# Patient Record
Sex: Male | Born: 2000 | Race: Black or African American | Hispanic: No | Marital: Single | Smoking: Current every day smoker
Health system: Southern US, Community
[De-identification: ages and names within clinical notes are randomized; demographics above are authoritative.]

## PROBLEM LIST (undated history)

## (undated) DIAGNOSIS — J45909 Unspecified asthma, uncomplicated: Secondary | ICD-10-CM

## (undated) DIAGNOSIS — R040 Epistaxis: Secondary | ICD-10-CM

## (undated) HISTORY — PX: ROOT CANAL: SHX2363

---
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
## 2018-01-31 ENCOUNTER — Other Ambulatory Visit: Payer: Self-pay

## 2018-01-31 ENCOUNTER — Emergency Department
Admission: EM | Admit: 2018-01-31 | Discharge: 2018-01-31 | Disposition: A | Discharge status: Home / Self Care | Payer: Medicaid Other | Attending: Emergency Medicine | Admitting: Emergency Medicine

## 2018-01-31 ENCOUNTER — Encounter: Payer: Self-pay | Admitting: Emergency Medicine

## 2018-01-31 DIAGNOSIS — J45909 Unspecified asthma, uncomplicated: Secondary | ICD-10-CM | POA: Diagnosis not present

## 2018-01-31 DIAGNOSIS — L738 Other specified follicular disorders: Secondary | ICD-10-CM | POA: Diagnosis not present

## 2018-01-31 DIAGNOSIS — Z79899 Other long term (current) drug therapy: Secondary | ICD-10-CM | POA: Diagnosis not present

## 2018-01-31 DIAGNOSIS — R51 Headache: Secondary | ICD-10-CM | POA: Diagnosis present

## 2018-01-31 DIAGNOSIS — F17290 Nicotine dependence, other tobacco product, uncomplicated: Secondary | ICD-10-CM | POA: Insufficient documentation

## 2018-01-31 MED ORDER — SULFAMETHOXAZOLE-TRIMETHOPRIM 800-160 MG PO TABS: 1.0000 | ORAL_TABLET | Freq: Two times a day (BID) | ORAL | 0 refills | Status: DC

## 2018-01-31 MED ORDER — NAPROXEN 500 MG PO TABS: 500.0000 mg | ORAL_TABLET | Freq: Two times a day (BID) | ORAL | Status: DC

## 2018-01-31 MED ORDER — HYDROXYZINE HCL 50 MG PO TABS: 50.0000 mg | ORAL_TABLET | Freq: Three times a day (TID) | ORAL | 0 refills | Status: AC | PRN

## 2018-01-31 NOTE — ED Notes (Signed)
See triage note  States he noticed a possible abscess to back of neck about 2 weeks ago  Area is larger and more tender

## 2018-01-31 NOTE — Discharge Instructions (Signed)
Follow discharge care instruction.  If no improvement in 7 to 10 days contact dermatology listed above.

## 2018-01-31 NOTE — ED Notes (Signed)
First Nurse Note: Pt c/o knot on the back of his head. Pt is in NAD at this time.

## 2018-01-31 NOTE — ED Provider Notes (Signed)
[facility] Emergency Department Provider Note   ____________________________________________   First MD Initiated Contact with Patient 01/31/18 1316     (approximate)  I have reviewed the triage vital signs and the nursing notes.   HISTORY  Chief Complaint Abscess    HPI Dennis Irwin is a 17 y.o. male patient who presents with 3 nodular lesion to the scalp.  Patient state lesion started.  Status post  haircut 1 month ago.  Patient rates pain as 8/10.  Patient described pain is "ache".  No palliative measure for complaint.  Past Medical History:  Diagnosis Date  . Asthma   . Epistaxis, recurrent     There are no active problems to display for this patient.   Past Surgical History:  Procedure Laterality Date  . ROOT CANAL      Prior to Admission medications   Medication Sig Start Date End Date Taking? Authorizing Provider  albuterol (PROVENTIL HFA;VENTOLIN HFA) 108 (90 Base) MCG/ACT inhaler Inhale 2 puffs into the lungs every 6 (six) hours as needed for wheezing or shortness of breath.    [provider]  diphenhydrAMINE (BENADRYL) 25 mg capsule Take 1 capsule (25 mg total) by mouth every 6 (six) hours as needed. 11/21/17   Triplett, Rulon Eisenmengerari B, FNP  hydrOXYzine (ATARAX/VISTARIL) 50 MG tablet Take 1 tablet (50 mg total) by mouth 3 (three) times daily as needed. 01/31/18   Joni ReiningSmith, Deannie Resetar K, PA-C  montelukast (SINGULAIR) 10 MG tablet Take 10 mg by mouth at bedtime.    [provider]  naproxen (NAPROSYN) 500 MG tablet Take 1 tablet (500 mg total) by mouth 2 (two) times daily with a meal. 01/31/18   Joni ReiningSmith, Reiss Mowrey K, PA-C  predniSONE (STERAPRED UNI-PAK 21 TAB) 10 MG (21) TBPK tablet Take 6 tablets on day 1 Take 5 tablets on day 2 Take 4 tablets on day 3 Take 3 tablets on day 4 Take 2 tablets on day 5 Take 1 tablet on day 6 11/21/17   Triplett, Cari B, FNP  sulfamethoxazole-trimethoprim (BACTRIM DS,SEPTRA DS) 800-160 MG tablet Take 1  tablet by mouth 2 (two) times daily. 01/31/18   Joni ReiningSmith, Alfonzo Arca K, PA-C    Allergies Patient has no known allergies.  No family history on file.  Social History Social History   Tobacco Use  . Smoking status: Current Every Day Smoker    Types: Cigars  . Smokeless tobacco: Never Used  Substance Use Topics  . Alcohol use: No  . Drug use: No    Review of Systems Constitutional: No fever/chills Eyes: No visual changes. ENT: No sore throat. Cardiovascular: Denies chest pain. Respiratory: Denies shortness of breath. Gastrointestinal: No abdominal pain.  No nausea, no vomiting.  No diarrhea.  No constipation. Genitourinary: Negative for dysuria. Musculoskeletal: Negative for back pain. Skin: Papular and nodular lesion in the scalp.   Neurological: Negative for headaches, focal weakness or numbness.   ____________________________________________   PHYSICAL EXAM:  VITAL SIGNS: ED Triage Vitals  Enc Vitals Group     BP 01/31/18 1258 119/81     Pulse Rate 01/31/18 1258 80     Resp 01/31/18 1258 18     Temp 01/31/18 1258 98.2 F (36.8 C)     Temp Source 01/31/18 1258 Oral     SpO2 01/31/18 1258 100 %     Weight 01/31/18 1259 165 lb (74.8 kg)     Height 01/31/18 1259 5\' 9"  (1.753 m)     Head Circumference --  Peak Flow --      Pain Score 01/31/18 1258 8     Pain Loc --      Pain Edu? --      Excl. in GC? --    Constitutional: Alert and oriented. Well appearing and in no acute distress. Cardiovascular: Normal rate, regular rhythm. Grossly normal heart sounds.  Good peripheral circulation. Respiratory: Normal respiratory effort.  No retractions. Lungs CTAB. Gastrointestinal: Soft and nontender. No distention. No abdominal bruits. No CVA tenderness. Musculoskeletal: No lower extremity tenderness nor edema.  No joint effusions. Neurologic:  Normal speech and language. No gross focal neurologic deficits are appreciated. No gait instability. Skin: 2 papular and one larger  lesion in the scalp.   Psychiatric: Mood and affect are normal. Speech and behavior are normal.  ____________________________________________   LABS (all labs ordered are listed, but only abnormal results are displayed)  Labs Reviewed - No data to display ____________________________________________  EKG   ____________________________________________  RADIOLOGY  ED MD interpretation:    Official radiology report(s): No results found.  ____________________________________________   PROCEDURES  Procedure(s) performed: None  Procedures  Critical Care performed: No  ____________________________________________   INITIAL IMPRESSION / ASSESSMENT AND PLAN / ED COURSE  As part of my medical decision making, I reviewed the following data within the electronic MEDICAL RECORD NUMBER    Folliculitis of the scalp status post haircut.  Lesion is nonfluctuant at this time.  Patient given discharge care instruction advised take medication as directed.  Patient advised no improvement in 7 to 10 days to follow-up by calling dermatology clinic listed on his discharge paperwork.      ____________________________________________   FINAL CLINICAL IMPRESSION(S) / ED DIAGNOSES  Final diagnoses:  Bacterial folliculitis     ED Discharge Orders        Ordered    sulfamethoxazole-trimethoprim (BACTRIM DS,SEPTRA DS) 800-160 MG tablet  2 times daily     01/31/18 1331    hydrOXYzine (ATARAX/VISTARIL) 50 MG tablet  3 times daily PRN     01/31/18 1331    naproxen (NAPROSYN) 500 MG tablet  2 times daily with meals     01/31/18 1334       Note:  This document was prepared using Dragon voice recognition software and may include unintentional dictation errors.    Joni Reining, PA-C 01/31/18 1339    Emily Filbert, MD 01/31/18 [phone]

## 2018-01-31 NOTE — ED Triage Notes (Signed)
Pt here with c/o a few abscesses to his head, states they are painful, appears in NAD.

## 2018-02-06 ENCOUNTER — Emergency Department
Admission: EM | Admit: 2018-02-06 | Discharge: 2018-02-06 | Disposition: A | Discharge status: Home / Self Care | Payer: Medicaid Other | Attending: Emergency Medicine | Admitting: Emergency Medicine

## 2018-02-06 DIAGNOSIS — Z79899 Other long term (current) drug therapy: Secondary | ICD-10-CM | POA: Diagnosis not present

## 2018-02-06 DIAGNOSIS — F17210 Nicotine dependence, cigarettes, uncomplicated: Secondary | ICD-10-CM | POA: Insufficient documentation

## 2018-02-06 DIAGNOSIS — J45909 Unspecified asthma, uncomplicated: Secondary | ICD-10-CM | POA: Diagnosis not present

## 2018-02-06 DIAGNOSIS — L989 Disorder of the skin and subcutaneous tissue, unspecified: Secondary | ICD-10-CM | POA: Diagnosis present

## 2018-02-06 MED ORDER — FLUCONAZOLE 200 MG PO TABS: 200.0000 mg | ORAL_TABLET | Freq: Two times a day (BID) | ORAL | 0 refills | Status: DC

## 2018-02-06 MED ORDER — LIDOCAINE HCL 1 % IJ SOLN
5.0000 mL | Freq: Once | INTRAMUSCULAR | Status: AC
  Administered 2018-02-06: 5 mL
  Filled 2018-02-06: qty 5

## 2018-02-06 MED ORDER — LIDOCAINE HCL (PF) 1 % IJ SOLN
INTRAMUSCULAR | Status: AC
  Administered 2018-02-06: 5 mL
  Filled 2018-02-06: qty 5

## 2018-02-06 NOTE — ED Provider Notes (Signed)
[facility] Emergency Department Provider Note  ____________________________________________  Time seen: Approximately 10:55 PM  I have reviewed the triage vital signs and the nursing notes.   HISTORY  Chief Complaint Abscess    HPI Dennis Irwin is a 17 y.o. male presents to the emergency department with a 2 cm x 2 cm left sided posterior neck mass at hairline that developed after patient got his haircut approximately 1 month ago.  Patient has been treated for bacterial folliculitis with bactrim as directed.  Patient reports that region seems to be worsening and has started draining what appears to be pus and blood.  No fever or chills.    Past Medical History:  Diagnosis Date  . Asthma   . Epistaxis, recurrent     There are no active problems to display for this patient.   Past Surgical History:  Procedure Laterality Date  . ROOT CANAL      Prior to Admission medications   Medication Sig Start Date End Date Taking? Authorizing Provider  albuterol (PROVENTIL HFA;VENTOLIN HFA) 108 (90 Base) MCG/ACT inhaler Inhale 2 puffs into the lungs every 6 (six) hours as needed for wheezing or shortness of breath.    [provider]  diphenhydrAMINE (BENADRYL) 25 mg capsule Take 1 capsule (25 mg total) by mouth every 6 (six) hours as needed. 11/21/17   Triplett, Rulon Eisenmenger B, FNP  fluconazole (DIFLUCAN) 200 MG tablet Take 1 tablet (200 mg total) by mouth 2 (two) times daily for 28 days. 02/06/18 03/06/18  Orvil Feil, PA-C  hydrOXYzine (ATARAX/VISTARIL) 50 MG tablet Take 1 tablet (50 mg total) by mouth 3 (three) times daily as needed. 01/31/18   Joni Reining, PA-C  montelukast (SINGULAIR) 10 MG tablet Take 10 mg by mouth at bedtime.    [provider]  naproxen (NAPROSYN) 500 MG tablet Take 1 tablet (500 mg total) by mouth 2 (two) times daily with a meal. 01/31/18   Joni Reining, PA-C  predniSONE (STERAPRED UNI-PAK 21 TAB) 10 MG (21) TBPK tablet  Take 6 tablets on day 1 Take 5 tablets on day 2 Take 4 tablets on day 3 Take 3 tablets on day 4 Take 2 tablets on day 5 Take 1 tablet on day 6 11/21/17   Triplett, Cari B, FNP  sulfamethoxazole-trimethoprim (BACTRIM DS,SEPTRA DS) 800-160 MG tablet Take 1 tablet by mouth 2 (two) times daily. 01/31/18   Joni Reining, PA-C    Allergies Patient has no known allergies.  No family history on file.  Social History Social History   Tobacco Use  . Smoking status: Current Every Day Smoker    Types: Cigars  . Smokeless tobacco: Never Used  Substance Use Topics  . Alcohol use: No  . Drug use: No     Review of Systems  Constitutional: No fever/chills Eyes: No visual changes. No discharge ENT: No upper respiratory complaints. Cardiovascular: no chest pain. Respiratory: no cough. No SOB. Gastrointestinal: No abdominal pain.  No nausea, no vomiting.  No diarrhea.  No constipation. Musculoskeletal: Negative for musculoskeletal pain. Skin: Patient has posterior neck mass Neurological: Negative for headaches, focal weakness or numbness. .  ____________________________________________   PHYSICAL EXAM:  VITAL SIGNS: ED Triage Vitals  Enc Vitals Group     BP --      Pulse Rate 02/06/18 2157 (!) 104     Resp 02/06/18 2157 15     Temp 02/06/18 2157 99.2 F (37.3 C)     Temp  Source 02/06/18 2157 Oral     SpO2 02/06/18 2157 100 %     Weight 02/06/18 2158 165 lb (74.8 kg)     Height 02/06/18 2158 5\' 9"  (1.753 m)     Head Circumference --      Peak Flow --      Pain Score 02/06/18 2203 9     Pain Loc --      Pain Edu? --      Excl. in GC? --      Constitutional: Alert and oriented. Well appearing and in no acute distress. Eyes: Conjunctivae are normal. PERRL. EOMI. Head: Atraumatic. ENT:      Ears: TMs are pearly.      Nose: No congestion/rhinnorhea.      Mouth/Throat: Mucous membranes are moist.  Neck: No stridor.  No cervical spine tenderness to palpation.   Cardiovascular: Normal rate, regular rhythm. Normal S1 and S2.  Good peripheral circulation. Respiratory: Normal respiratory effort without tachypnea or retractions. Lungs CTAB. Good air entry to the bases with no decreased or absent breath sounds. Musculoskeletal: Full range of motion to all extremities. No gross deformities appreciated. Neurologic:  Normal speech and language. No gross focal neurologic deficits are appreciated.  Skin:  Patient has a 2 cm x 2 cm left-sided posterior neck mass where the neck meets hairline.  Region is extremely fluctuant without overlying cellulitis. Psychiatric: Mood and affect are normal. Speech and behavior are normal. Patient exhibits appropriate insight and judgement.   ____________________________________________   LABS (all labs ordered are listed, but only abnormal results are displayed)  Labs Reviewed - No data to display ____________________________________________  EKG   ____________________________________________  RADIOLOGY  No results found.  ____________________________________________    PROCEDURES  Procedure(s) performed:    Procedures  INCISION AND DRAINAGE Performed by: Orvil FeilJaclyn M Annasofia Pohl Consent: Verbal consent obtained. Risks and benefits: risks, benefits and alternatives were discussed Type: abscess  Body area: Posterior neck   Anesthesia: local infiltration  Incision was made with a scalpel.  Local anesthetic: lidocaine 1% without epinephrine  Anesthetic total: 3 ml  Complexity: complex Blunt dissection to break up loculations  Drainage: purulent  Drainage amount: Small  Patient tolerance: Patient tolerated the procedure well with no immediate complications.     Medications  lidocaine (XYLOCAINE) 1 % (with pres) injection 5 mL (5 mLs Infiltration Given 02/06/18 2230)     ____________________________________________   INITIAL IMPRESSION / ASSESSMENT AND PLAN / ED COURSE  Pertinent labs &  imaging results that were available during my care of the patient were reviewed by me and considered in my medical decision making (see chart for details).  Review of the [place] CSRS was performed in accordance of the [facility] prior to dispensing any controlled drugs.      Assessment and plan Scalp mass  Differential diagnosis included bscess versus kerion.  Patient underwent incision and drainage in the emergency department which yielded only a small amount of purulent exudate.  Patient was treated empirically for a Kerion with fluconazole.  Patient was instructed to follow-up with dermatology if symptoms do not improve in 1 month.  Patient was advised to return to the emergency department if symptoms acutely worsen.  All patient questions were answered.   ____________________________________________  FINAL CLINICAL IMPRESSION(S) / ED DIAGNOSES  Final diagnoses:  Scalp lesion      NEW MEDICATIONS STARTED DURING THIS VISIT:  ED Discharge Orders        Ordered    fluconazole (DIFLUCAN) 200  MG tablet  2 times daily     02/06/18 2249          This chart was dictated using voice recognition software/Dragon. Despite best efforts to proofread, errors can occur which can change the meaning. Any change was purely unintentional.    Orvil Feil, PA-C 02/06/18 2310    Don Perking, [place], MD 02/08/18 1540

## 2018-02-06 NOTE — ED Triage Notes (Signed)
Patient c/o abscess to posterior left head.

## 2018-02-28 ENCOUNTER — Emergency Department: Payer: Medicaid Other

## 2018-02-28 ENCOUNTER — Emergency Department
Admission: EM | Admit: 2018-02-28 | Discharge: 2018-02-28 | Disposition: A | Discharge status: Home / Self Care | Payer: Medicaid Other | Attending: Emergency Medicine | Admitting: Emergency Medicine

## 2018-02-28 ENCOUNTER — Other Ambulatory Visit: Payer: Self-pay

## 2018-02-28 ENCOUNTER — Encounter: Payer: Self-pay | Admitting: Emergency Medicine

## 2018-02-28 DIAGNOSIS — R51 Headache: Secondary | ICD-10-CM | POA: Diagnosis not present

## 2018-02-28 DIAGNOSIS — R519 Headache, unspecified: Secondary | ICD-10-CM

## 2018-02-28 DIAGNOSIS — L729 Follicular cyst of the skin and subcutaneous tissue, unspecified: Secondary | ICD-10-CM

## 2018-02-28 DIAGNOSIS — L723 Sebaceous cyst: Secondary | ICD-10-CM | POA: Insufficient documentation

## 2018-02-28 LAB — CBC WITH DIFFERENTIAL/PLATELET
Basophils Absolute: 0 10*3/uL (ref 0–0.1)
Basophils Relative: 0 %
EOS PCT: 2 %
Eosinophils Absolute: 0.1 10*3/uL (ref 0–0.7)
HCT: 43 % (ref 40.0–52.0)
Hemoglobin: 14.5 g/dL (ref 13.0–18.0)
LYMPHS ABS: 2 10*3/uL (ref 1.0–3.6)
LYMPHS PCT: 33 %
MCH: 28.1 pg (ref 26.0–34.0)
MCHC: 33.7 g/dL (ref 32.0–36.0)
MCV: 83.3 fL (ref 80.0–100.0)
Monocytes Absolute: 0.5 10*3/uL (ref 0.2–1.0)
Monocytes Relative: 8 %
Neutro Abs: 3.5 10*3/uL (ref 1.4–6.5)
Neutrophils Relative %: 57 %
PLATELETS: 204 10*3/uL (ref 150–440)
RBC: 5.16 MIL/uL (ref 4.40–5.90)
RDW: 13.4 % (ref 11.5–14.5)
WBC: 6.1 10*3/uL (ref 3.8–10.6)

## 2018-02-28 MED ORDER — CLINDAMYCIN HCL 150 MG PO CAPS: 300.0000 mg | ORAL_CAPSULE | Freq: Three times a day (TID) | ORAL | 0 refills | Status: AC

## 2018-02-28 NOTE — ED Provider Notes (Addendum)
[facility] Emergency Department Provider Note  ____________________________________________   First MD Initiated Contact with Patient 02/28/18 1251     (approximate)  I have reviewed the triage vital signs and the nursing notes.   HISTORY  Chief Complaint Headache    HPI Dennis Irwin is a 17 y.o. male presents emergency department complaining of a cyst like abscess on the scalp which is new and causing him to have a bad headache and more swelling at the area that was drained along side of his neck.  He states he was instructed to follow-up with a dermatologist but has not been able to get an appointment.  He states he finished the Bactroban which helped just a little and he has been applying an ointment to the scalp without any relief.  States he is getting migraines  due to the bumps on the scalp.  Past Medical History:  Diagnosis Date  . Asthma   . Epistaxis, recurrent     There are no active problems to display for this patient.   Past Surgical History:  Procedure Laterality Date  . ROOT CANAL      Prior to Admission medications   Medication Sig Start Date End Date Taking? Authorizing Provider  albuterol (PROVENTIL HFA;VENTOLIN HFA) 108 (90 Base) MCG/ACT inhaler Inhale 2 puffs into the lungs every 6 (six) hours as needed for wheezing or shortness of breath.    [provider]  clindamycin (CLEOCIN) 150 MG capsule Take 2 capsules (300 mg total) by mouth 3 (three) times daily. 02/28/18   Kennidee Heyne, Roselyn BeringSusan W, PA-C  hydrOXYzine (ATARAX/VISTARIL) 50 MG tablet Take 1 tablet (50 mg total) by mouth 3 (three) times daily as needed. 01/31/18   Joni ReiningSmith, Ronald K, PA-C  montelukast (SINGULAIR) 10 MG tablet Take 10 mg by mouth at bedtime.    [provider]    Allergies Patient has no known allergies.  No family history on file.  Social History Social History   Tobacco Use  . Smoking status: Current Every Day Smoker    Types: Cigars    . Smokeless tobacco: Never Used  Substance Use Topics  . Alcohol use: No  . Drug use: No    Review of Systems  Constitutional: No fever/chills positive for headache Eyes: No visual changes. ENT: No sore throat. Respiratory: Denies cough Genitourinary: Negative for dysuria. Musculoskeletal: Negative for back pain. Skin: Negative for rash.  Positive for 2 swollen abscessed cystlike areas    ____________________________________________   PHYSICAL EXAM:  VITAL SIGNS: ED Triage Vitals  Enc Vitals Group     BP 02/28/18 1239 107/64     Pulse Rate 02/28/18 1239 68     Resp 02/28/18 1239 16     Temp 02/28/18 1239 98.3 F (36.8 C)     Temp Source 02/28/18 1239 Oral     SpO2 02/28/18 1239 98 %     Weight 02/28/18 1240 165 lb (74.8 kg)     Height 02/28/18 1240 5\' 10"  (1.778 m)     Head Circumference --      Peak Flow --      Pain Score 02/28/18 1240 10     Pain Loc --      Pain Edu? --      Excl. in GC? --     Constitutional: Alert and oriented. Well appearing and in no acute distress. Eyes: Conjunctivae are normal.  Head: Atraumatic.  Positive for a goose egg sized raised fluctuant lesion on  the top of the scalp, the hair is not present on this lesion, area is tender to palpation Nose: No congestion/rhinnorhea. Neck: Is supple, positive for cervical lymphadenopathy, positive for a swollen questionable abscess which feels fluctuant at the base of the skull and side of the neck.  This is the same area that was drained a few days ago. Mouth/Throat: Mucous membranes are moist.   Cardiovascular: Normal rate, regular rhythm. Respiratory: Normal respiratory effort.  No retractions GU: deferred Musculoskeletal: FROM all extremities, warm and well perfused Neurologic:  Normal speech and language.  Skin:  Skin is warm, dry and intact. No rash noted.  Cystlike lesions as noted on the scalp and side of the neck. Psychiatric: Mood and affect are normal. Speech and behavior are  normal.  ____________________________________________   LABS (all labs ordered are listed, but only abnormal results are displayed)  Labs Reviewed  AEROBIC CULTURE (SUPERFICIAL SPECIMEN)  CBC WITH DIFFERENTIAL/PLATELET   ____________________________________________   ____________________________________________  RADIOLOGY  CT of the head shows no intracranial lesions, the cystlike areas are confined to the scalp  ____________________________________________   PROCEDURES  Procedure(s) performed:   Marland KitchenMarland KitchenIncision and Drainage Date/Time: 02/28/2018 3:31 PM Performed by: Faythe Ghee, PA-C Authorized by: Faythe Ghee, PA-C   Consent:    Consent obtained:  Verbal   Consent given by:  Patient   Risks discussed:  Bleeding, incomplete drainage, pain and infection   Alternatives discussed:  No treatment Location:    Type:  Cyst   Location:  Head   Head location:  Scalp Pre-procedure details:    Skin preparation:  Antiseptic wash Procedure type:    Complexity:  Simple Procedure details:    Needle aspiration: yes     Needle size:  18 G   Drainage:  Purulent and serous   Drainage amount:  Moderate   Wound treatment:  Wound left open   Packing materials:  None Post-procedure details:    Patient tolerance of procedure:  Tolerated well, no immediate complications      ____________________________________________   INITIAL IMPRESSION / ASSESSMENT AND PLAN / ED COURSE  Pertinent labs & imaging results that were available during my care of the patient were reviewed by me and considered in my medical decision making (see chart for details).  Patient is an 17 year old male presents emergency department complaining of 2 cystlike areas one on the scalp and one on the side of the neck.  The one on the side of the neck had recently been drained but has now filled back up with a much wet fluid.  He denies any fever or chills.  He states he did finish the antibiotic.  On  physical exam the scalp lesion is the size of a goose egg and is very fluctuant.  The one on the side of the neck is oval-shaped and fluctuant.  There is a small amount of lymphadenopathy noted surrounding this area.  Due to the patient's complaints of migraine headache since this lesion appeared CT of the head was ordered.  CT of the head is negative for any acute abnormality.  The lesions are confined to the scalp.  A needle aspiration was done of the scalp lesion.  A clear liquid the but somewhat purulent fluid was removed.  This was sent to the lab for culture.  The patient was given a prescription for clindamycin.  He is to follow-up with dermatology.  Phone number was given for [facility] dermatology.  He states he understands and will  comply with my instructions.  He was discharged in stable condition.     As part of my medical decision making, I reviewed the following data within the electronic MEDICAL RECORD NUMBER Nursing notes reviewed and incorporated, Labs reviewed CBC is normal, Radiograph reviewed CT the head shows no acute abnormality and the cystic lesion is confined to the scalp, Notes from prior ED visits and [place] Controlled Substance Database  ____________________________________________   FINAL CLINICAL IMPRESSION(S) / ED DIAGNOSES  Final diagnoses:  Scalp cyst  New onset headache      NEW MEDICATIONS STARTED DURING THIS VISIT:  Discharge Medication List as of 02/28/2018  2:25 PM    START taking these medications   Details  clindamycin (CLEOCIN) 150 MG capsule Take 2 capsules (300 mg total) by mouth 3 (three) times daily., Starting Fri 02/28/2018, Print         Note:  This document was prepared using Dragon voice recognition software and may include unintentional dictation errors.    Faythe Ghee, PA-C 02/28/18 1552    Faythe Ghee, PA-C 02/28/18 1553    Jeanmarie Plant, MD 03/01/18 1322

## 2018-02-28 NOTE — ED Triage Notes (Signed)
Patient presents to the ED complaining of a headache that is in the area where he has an abscess on his head.  Patient has taken antibiotics for abscess.  Patient was seen approx. 3 weeks ago and states he has about 4 pills left.  Patient is in no obvious distress at this time.  States area on his head is throbbing.

## 2018-02-28 NOTE — ED Notes (Addendum)
Pt states H/A started 3 days ago. Pt took 4X Ibuprofen within the last 3 days to decrease H/A. Sleeping helps with H/A as well. No distress noted. Pt A/O X4

## 2018-02-28 NOTE — Discharge Instructions (Addendum)
Take medication as prescribed.  Follow-up with dermatology.  Apply warm compress to this area.  Return if worsening

## 2018-03-03 LAB — AEROBIC CULTURE W GRAM STAIN (SUPERFICIAL SPECIMEN): Special Requests: NORMAL

## 2018-03-03 LAB — AEROBIC CULTURE  (SUPERFICIAL SPECIMEN): CULTURE: NO GROWTH

## 2018-09-29 IMAGING — CT CT HEAD W/O CM
3 series · 15 of 47 positions shown, 18 images · non-contrast
Comparison: None.

CLINICAL DATA: Headache.  Recent scalp abscess

EXAM:
CT HEAD WITHOUT CONTRAST
TECHNIQUE: Contiguous axial images were obtained from the base of the skull
through the vertex without intravenous contrast.

[Series 2: head wo · axial · 0.41mm/px · z∈[-58,+67]mm · 9 of 30 slices shown, 12 images]
[im 3/30  brain]
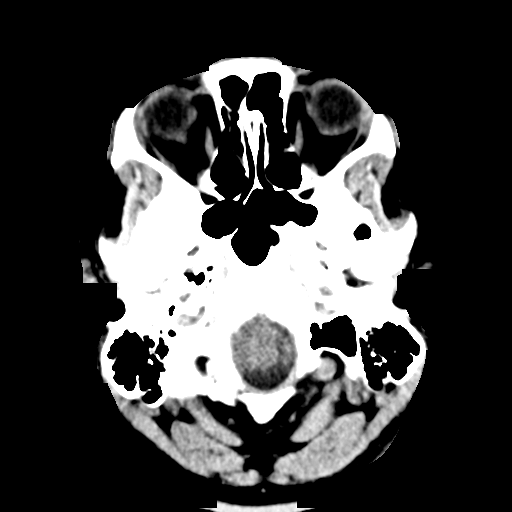
[im 3/30  bone]
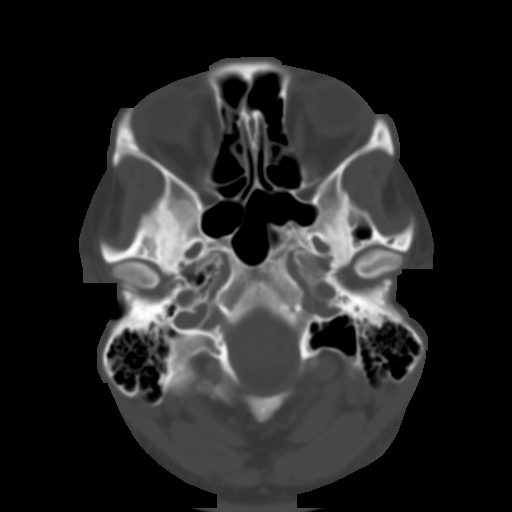
[im 6/30  brain]
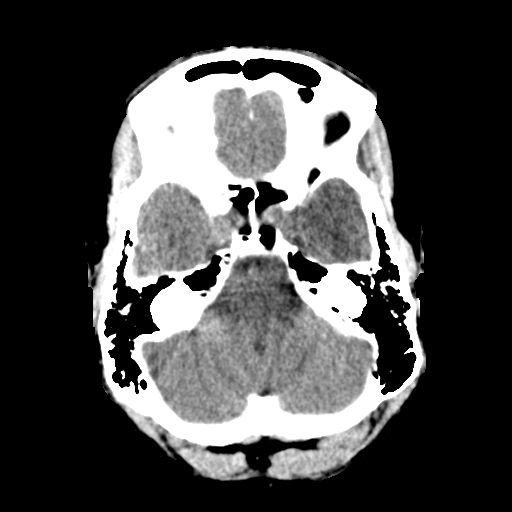
[im 9/30  brain]
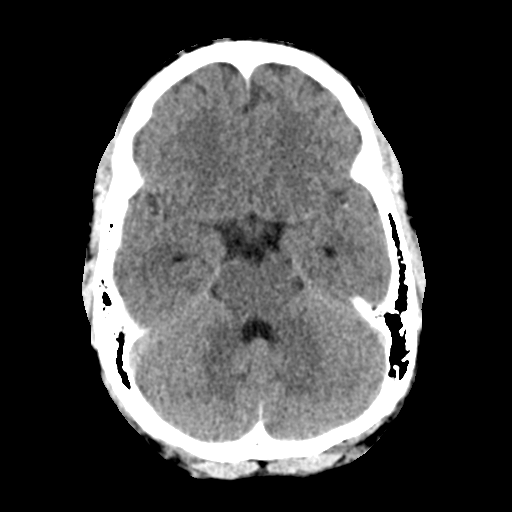
[im 12/30  brain]
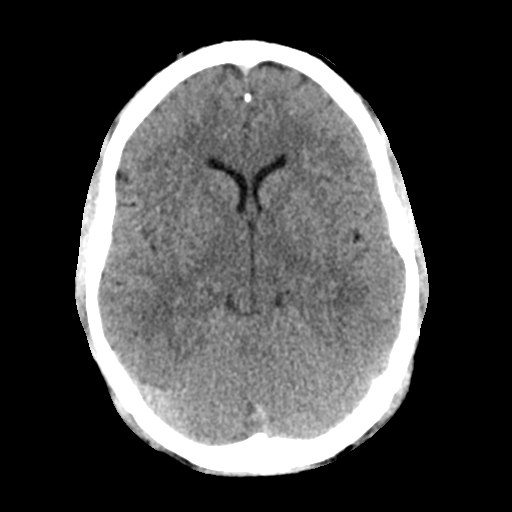
[im 16/30  brain]
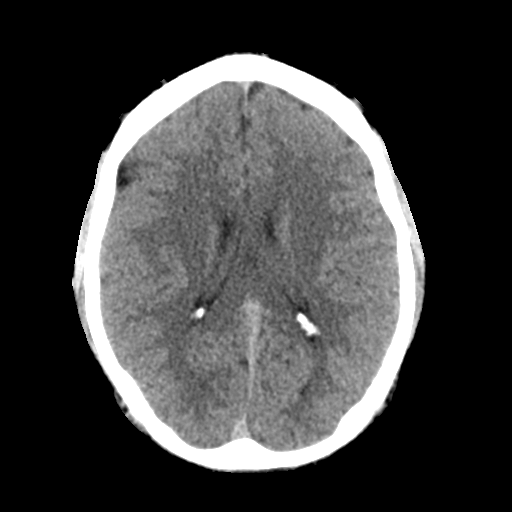
[im 16/30  bone]
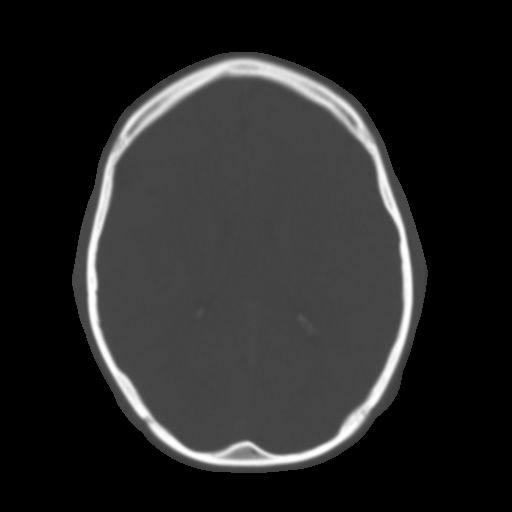
[im 19/30  brain]
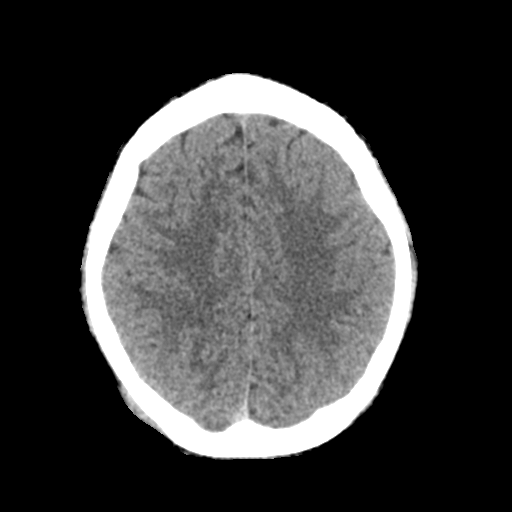
[im 22/30  brain]
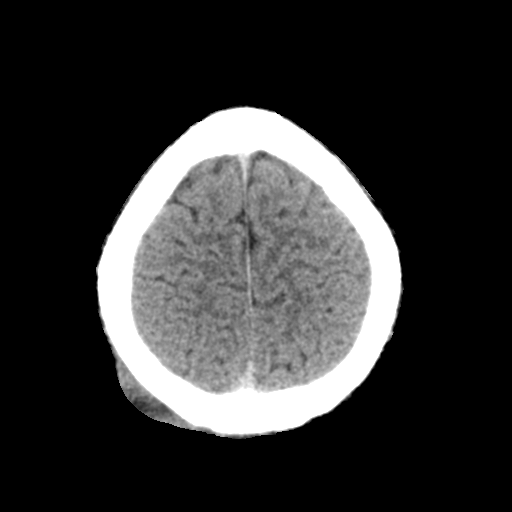
[im 25/30  brain]
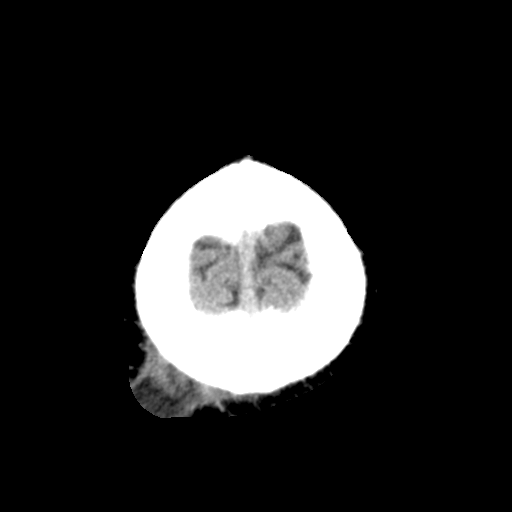
[im 28/30  brain]
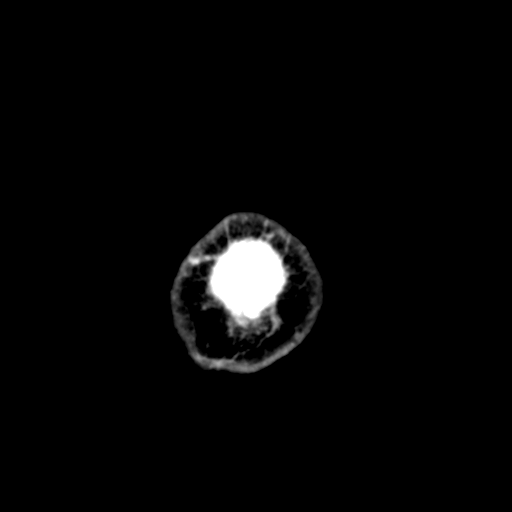
[im 28/30  bone]
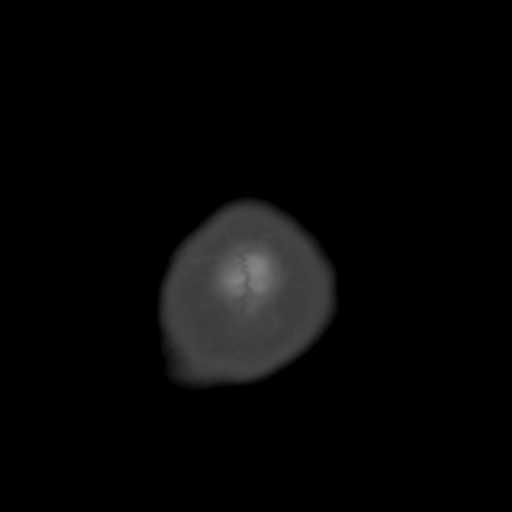

[Series 4: coronal soft tissue · coronal · 0.30mm/px · 3 of 61 slices shown]
[im 21/61  brain]
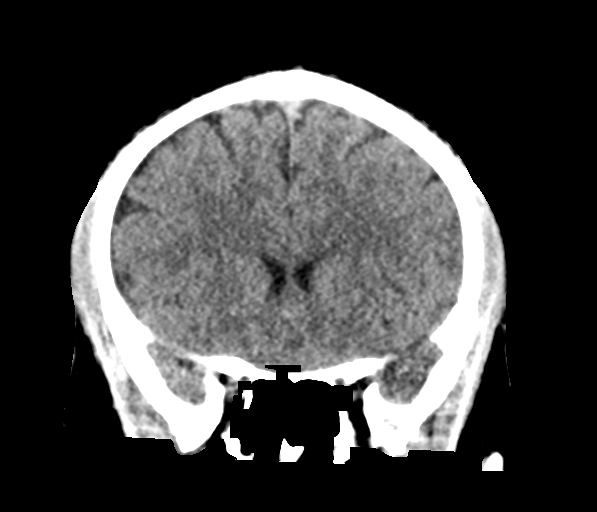
[im 27/61  brain]
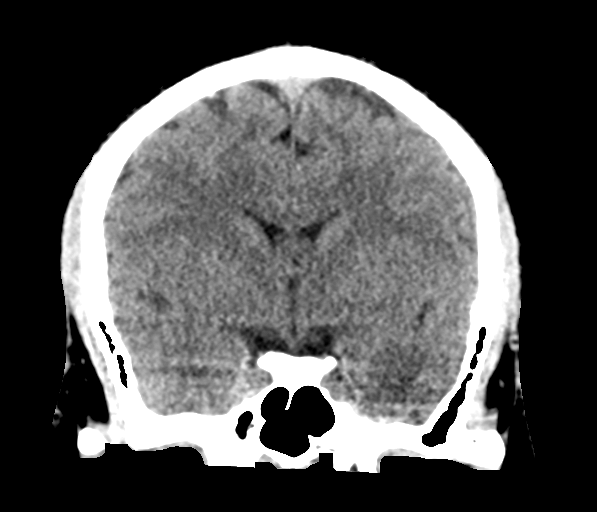
[im 34/61  brain]
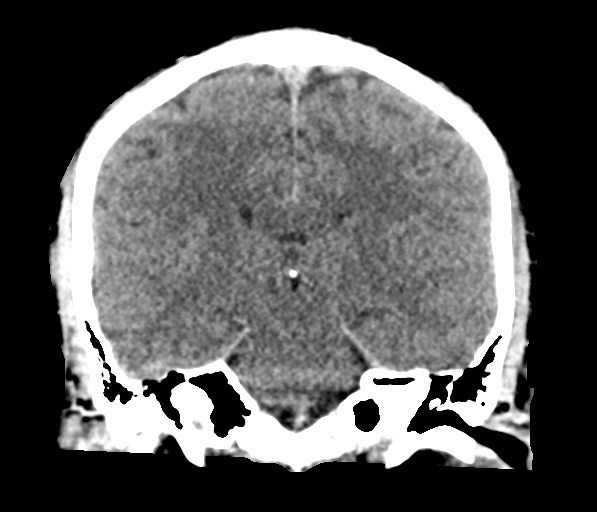

[Series 5: sagittal soft tissue · sagittal · 0.31mm/px · 3 of 52 slices shown]
[im 18/52  brain]
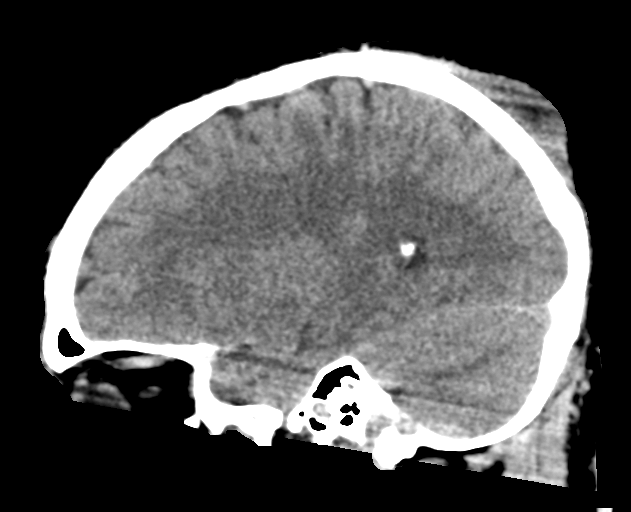
[im 26/52  brain]
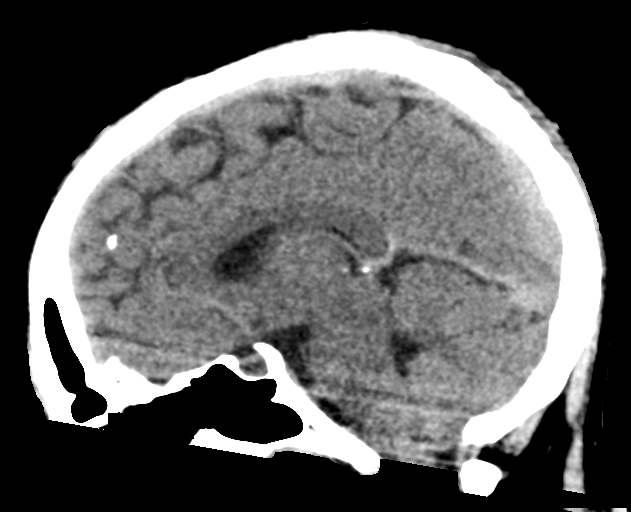
[im 35/52  brain]
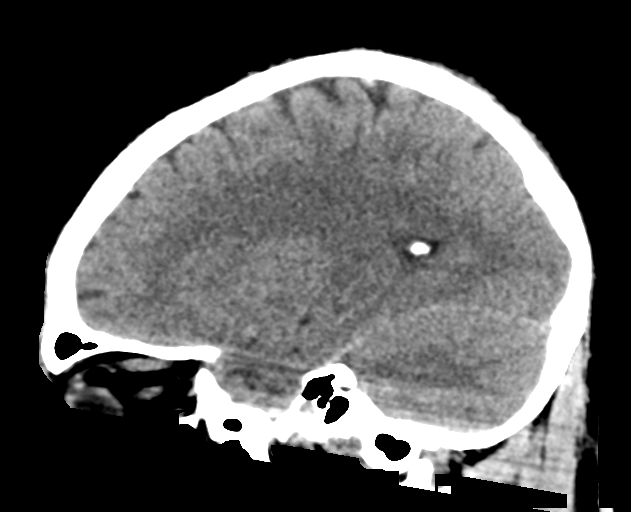

[15 of 47 positions shown; findings below may reference images not displayed]

FINDINGS: Brain: The ventricles are normal in size and configuration. There is
no intracranial mass, hemorrhage, extra-axial fluid collection, or
midline shift. The gray-white compartments appear normal. No evident
acute infarct.

Vascular: No hyperdense vessel evident. No vascular calcification
appreciable.

Skull: The bony calvarium appears intact.

There is a scalp lesion in the right parietal region measuring 3.2 x
2.5 x 2.2 cm. This lesion has an increased attenuation periphery
with a central area of material showing attenuation slightly higher
than serous fluid. No similar scalp lesions are evident.

Sinuses/Orbits: There is opacification in a posterior right ethmoid
air cell. There is mild mucosal thickening in several ethmoid air
cells. Other visualized paranasal sinuses are clear. Visualized
orbits appear symmetric bilaterally.

Other: Visualized mastoid air cells are clear.
IMPRESSION: Scalp lesion arising in right parietal area measuring 3.2 x 2.5 x
2.2 cm. Infected collection cannot be excluded given the history and
the appearance of this lesion. No bony abnormality.

Study otherwise unremarkable except for mild ethmoid air cell
disease. No intracranial lesion evident.

## 2020-04-13 DEATH — deceased
